# Patient Record
Sex: Female | Born: 1955 | Race: Black or African American | Hispanic: No | Smoking: Never smoker
Health system: Southern US, Community
[De-identification: ages and names within clinical notes are randomized; demographics above are authoritative.]

---

## 1997-12-03 ENCOUNTER — Ambulatory Visit (HOSPITAL_COMMUNITY): Admission: RE | Admit: 1997-12-03 | Discharge: 1997-12-04 | Payer: Self-pay

## 1999-01-25 ENCOUNTER — Other Ambulatory Visit: Admission: RE | Admit: 1999-01-25 | Discharge: 1999-01-25 | Payer: Self-pay | Admitting: *Deleted

## 2000-08-08 ENCOUNTER — Encounter: Admission: RE | Admit: 2000-08-08 | Discharge: 2000-08-08 | Payer: Self-pay | Admitting: *Deleted

## 2000-08-08 ENCOUNTER — Encounter: Payer: Self-pay | Admitting: *Deleted

## 2001-10-15 ENCOUNTER — Other Ambulatory Visit: Admission: RE | Admit: 2001-10-15 | Discharge: 2001-10-15 | Payer: Self-pay | Admitting: *Deleted

## 2003-11-25 ENCOUNTER — Other Ambulatory Visit: Admission: RE | Admit: 2003-11-25 | Discharge: 2003-11-25 | Payer: Self-pay | Admitting: Family Medicine

## 2005-05-26 ENCOUNTER — Other Ambulatory Visit: Admission: RE | Admit: 2005-05-26 | Discharge: 2005-05-26 | Payer: Self-pay | Admitting: Family Medicine

## 2007-01-15 ENCOUNTER — Encounter: Admission: RE | Admit: 2007-01-15 | Discharge: 2007-01-15 | Payer: Self-pay | Admitting: Family Medicine

## 2007-12-04 ENCOUNTER — Encounter: Admission: RE | Admit: 2007-12-04 | Discharge: 2007-12-04 | Payer: Self-pay | Admitting: Family Medicine

## 2014-03-03 ENCOUNTER — Other Ambulatory Visit: Payer: Self-pay | Admitting: Family Medicine

## 2014-03-03 ENCOUNTER — Other Ambulatory Visit (HOSPITAL_COMMUNITY)
Admission: RE | Admit: 2014-03-03 | Discharge: 2014-03-03 | Disposition: A | Discharge status: Home / Self Care | Payer: BC Managed Care – PPO | Source: Ambulatory Visit | Attending: Family Medicine | Admitting: Family Medicine

## 2014-03-03 DIAGNOSIS — Z124 Encounter for screening for malignant neoplasm of cervix: Secondary | ICD-10-CM | POA: Insufficient documentation

## 2014-03-05 LAB — CYTOLOGY - PAP

## 2017-09-25 ENCOUNTER — Other Ambulatory Visit (HOSPITAL_COMMUNITY)
Admission: RE | Admit: 2017-09-25 | Discharge: 2017-09-25 | Disposition: A | Discharge status: Home / Self Care | Payer: BC Managed Care – PPO | Source: Ambulatory Visit | Attending: Family Medicine | Admitting: Family Medicine

## 2017-09-25 ENCOUNTER — Other Ambulatory Visit: Payer: Self-pay | Admitting: Family Medicine

## 2017-09-25 DIAGNOSIS — Z124 Encounter for screening for malignant neoplasm of cervix: Secondary | ICD-10-CM | POA: Insufficient documentation

## 2017-09-27 LAB — CYTOLOGY - PAP
Diagnosis: NEGATIVE
HPV: NOT DETECTED

## 2021-02-20 ENCOUNTER — Encounter (HOSPITAL_COMMUNITY): Payer: Self-pay | Admitting: Emergency Medicine

## 2021-02-20 ENCOUNTER — Emergency Department (HOSPITAL_COMMUNITY)
Admission: EM | Admit: 2021-02-20 | Discharge: 2021-02-20 | Disposition: A | Discharge status: Home / Self Care | Payer: BC Managed Care – PPO | Attending: Emergency Medicine | Admitting: Emergency Medicine

## 2021-02-20 ENCOUNTER — Other Ambulatory Visit: Payer: Self-pay

## 2021-02-20 ENCOUNTER — Emergency Department (HOSPITAL_COMMUNITY): Payer: BC Managed Care – PPO

## 2021-02-20 DIAGNOSIS — H539 Unspecified visual disturbance: Secondary | ICD-10-CM

## 2021-02-20 DIAGNOSIS — H538 Other visual disturbances: Secondary | ICD-10-CM | POA: Diagnosis not present

## 2021-02-20 LAB — CBC WITH DIFFERENTIAL/PLATELET
Abs Immature Granulocytes: 0.02 10*3/uL (ref 0.00–0.07)
Basophils Absolute: 0 10*3/uL (ref 0.0–0.1)
Basophils Relative: 0 %
Eosinophils Absolute: 0.4 10*3/uL (ref 0.0–0.5)
Eosinophils Relative: 5 %
HCT: 43.4 % (ref 36.0–46.0)
Hemoglobin: 13.2 g/dL (ref 12.0–15.0)
Immature Granulocytes: 0 %
Lymphocytes Relative: 34 %
Lymphs Abs: 2.3 10*3/uL (ref 0.7–4.0)
MCH: 27.3 pg (ref 26.0–34.0)
MCHC: 30.4 g/dL (ref 30.0–36.0)
MCV: 89.7 fL (ref 80.0–100.0)
Monocytes Absolute: 0.6 10*3/uL (ref 0.1–1.0)
Monocytes Relative: 8 %
Neutro Abs: 3.5 10*3/uL (ref 1.7–7.7)
Neutrophils Relative %: 53 %
Platelets: 218 10*3/uL (ref 150–400)
RBC: 4.84 MIL/uL (ref 3.87–5.11)
RDW: 14.6 % (ref 11.5–15.5)
WBC: 6.8 10*3/uL (ref 4.0–10.5)
nRBC: 0 % (ref 0.0–0.2)

## 2021-02-20 LAB — BASIC METABOLIC PANEL
Anion gap: 10 (ref 5–15)
BUN: 20 mg/dL (ref 8–23)
CO2: 20 mmol/L — ABNORMAL LOW (ref 22–32)
Calcium: 9.7 mg/dL (ref 8.9–10.3)
Chloride: 108 mmol/L (ref 98–111)
Creatinine, Ser: 0.84 mg/dL (ref 0.44–1.00)
GFR, Estimated: >60 mL/min (ref >60)
Glucose, Bld: 91 mg/dL (ref 70–99)
Potassium: 4 mmol/L (ref 3.5–5.1)
Sodium: 138 mmol/L (ref 135–145)

## 2021-02-20 NOTE — ED Provider Notes (Signed)
Emergency Medicine Provider Triage Evaluation Note  Kimberly Flores , a 64 y.o. female  was evaluated in triage.  Pt complains of blurry vision, left eye only, no pain. Onset yesterday after attending a funeral. Using a lubricating drop without relief. No other deficits noted. Wears glasses, history of cataracts.  Review of Systems  Positive: Blurry vision Negative: Headache, eye pain  Physical Exam  BP 140/88 (BP Location: Right Arm)   Pulse 87   Temp 98.6 F (37 C) (Oral)   Resp 18   SpO2 100%  Gen:   Awake, no distress   Resp:  Normal effort  MSK:   Moves extremities without difficulty  Other:  Pupils equal and reactive   Medical Decision Making  Medically screening exam initiated at 6:02 PM.  Appropriate orders placed.  Kimberly Flores was informed that the remainder of the evaluation will be completed by another provider, this initial triage assessment does not replace that evaluation, and the importance of remaining in the ED until their evaluation is complete.     [name], PA-C 02/20/21 1809    [name], MD 02/22/21 1013

## 2021-02-20 NOTE — ED Triage Notes (Signed)
C/o blurred vision to L eye since yesterday.  Denies pain. Denies injury.

## 2021-02-20 NOTE — ED Provider Notes (Signed)
[facility] EMERGENCY DEPARTMENT Provider Note   CSN: [id number] Arrival date & time: 02/20/21  1753     History No chief complaint on file.   Kimberly Flores is a 65 y.o. female.  Pt presents to the ED today with left eye blurry vision.  Pt said sx started yesterday while attending a funeral.  She has tried some otc eye drops without relief.  She said she can see dark/light.  All visual fields are affected.  Pt does wear glasses and still has the blurry vision with glasses.        History reviewed. No pertinent past medical history.  There are no problems to display for this patient.   History reviewed. No pertinent surgical history.   OB History   No obstetric history on file.     No family history on file.  Social History   Tobacco Use   Smoking status: Never   Smokeless tobacco: Never  Substance Use Topics   Alcohol use: Not Currently   Drug use: Not Currently    Home Medications Prior to Admission medications   Not on File    Allergies    Patient has no allergy information on record.  Review of Systems   Review of Systems  Eyes:  Positive for visual disturbance.  All other systems reviewed and are negative.  Physical Exam Updated Vital Signs BP 140/88 (BP Location: Right Arm)   Pulse 87   Temp 98.6 F (37 C) (Oral)   Resp 18   SpO2 100%   Physical Exam Vitals and nursing note reviewed.  Constitutional:      Appearance: Normal appearance.  HENT:     Head: Normocephalic and atraumatic.     Right Ear: External ear normal.     Left Ear: External ear normal.     Nose: Nose normal.     Mouth/Throat:     Mouth: Mucous membranes are moist.     Pharynx: Oropharynx is clear.  Eyes:     Extraocular Movements: Extraocular movements intact.     Conjunctiva/sclera: Conjunctivae normal.     Pupils: Pupils are equal, round, and reactive to light.     Comments: No red reflex left eye.  Suspect vitreous hemorrhage.  Cardiovascular:      Rate and Rhythm: Normal rate and regular rhythm.     Pulses: Normal pulses.     Heart sounds: Normal heart sounds.  Pulmonary:     Effort: Pulmonary effort is normal.     Breath sounds: Normal breath sounds.  Abdominal:     General: Abdomen is flat. Bowel sounds are normal.     Palpations: Abdomen is soft.  Musculoskeletal:        General: Normal range of motion.     Cervical back: Normal range of motion and neck supple.  Skin:    General: Skin is warm.     Capillary Refill: Capillary refill takes less than 2 seconds.  Neurological:     General: No focal deficit present.     Mental Status: She is alert and oriented to person, place, and time.  Psychiatric:        Mood and Affect: Mood normal.        Behavior: Behavior normal.        Thought Content: Thought content normal.        Judgment: Judgment normal.    ED Results / Procedures / Treatments   Labs (all labs ordered are listed, but  only abnormal results are displayed) Labs Reviewed  BASIC METABOLIC PANEL - Abnormal; Notable for the following components:      Result Value   CO2 20 (*)    All other components within normal limits  CBC WITH DIFFERENTIAL/PLATELET  URINALYSIS, ROUTINE W REFLEX MICROSCOPIC    EKG None  Radiology MR BRAIN WO CONTRAST  Result Date: 02/20/2021 CLINICAL DATA:  Acute neurologic deficit EXAM: MRI HEAD WITHOUT CONTRAST TECHNIQUE: Multiplanar, multiecho pulse sequences of the brain and surrounding structures were obtained without intravenous contrast. COMPARISON:  None. FINDINGS: Brain: No acute infarct, mass effect or extra-axial collection. No acute or chronic hemorrhage. Normal white matter signal. Generalized volume loss without a clear lobar predilection. The midline structures are normal. Vascular: Major flow voids are preserved. Skull and upper cervical spine: Normal calvarium and skull base. Visualized upper cervical spine and soft tissues are normal. Sinuses/Orbits:No paranasal sinus  fluid levels or advanced mucosal thickening. No mastoid or middle ear effusion. Normal orbits. IMPRESSION: 1. No acute intracranial abnormality. 2. Generalized volume loss without a clear lobar predilection. Electronically Signed   By: Deatra Robinson M.D.   On: 02/20/2021 23:09    Procedures Procedures   Medications Ordered in ED Medications - No data to display  ED Course  I have reviewed the triage vital signs and the nursing notes.  Pertinent labs & imaging results that were available during my care of the patient were reviewed by me and considered in my medical decision making (see chart for details).    MDM Rules/Calculators/A&P                           MRI nl.  Pt instructed to f/u with Dr. Sherrine Maples (ophthalmology).  Return if worse. Final Clinical Impression(s) / ED Diagnoses Final diagnoses:  Vision disturbance    Rx / DC Orders ED Discharge Orders     None        Jacalyn Lefevre, MD 02/20/21 2319

## 2021-02-20 NOTE — ED Notes (Signed)
Pt transported to MRI 

## 2021-02-20 NOTE — ED Notes (Signed)
Patient transported to MRI 

## 2021-02-20 NOTE — ED Notes (Signed)
MRI   Jacalyn Lefevre, MD 02/20/21 2318

## 2022-11-27 DIAGNOSIS — B351 Tinea unguium: Secondary | ICD-10-CM | POA: Diagnosis not present

## 2022-11-27 DIAGNOSIS — M353 Polymyalgia rheumatica: Secondary | ICD-10-CM | POA: Diagnosis not present

## 2022-11-27 DIAGNOSIS — Z23 Encounter for immunization: Secondary | ICD-10-CM | POA: Diagnosis not present

## 2022-11-27 DIAGNOSIS — Z0000 Encounter for general adult medical examination without abnormal findings: Secondary | ICD-10-CM | POA: Diagnosis not present

## 2022-11-27 DIAGNOSIS — J309 Allergic rhinitis, unspecified: Secondary | ICD-10-CM | POA: Diagnosis not present

## 2022-11-27 DIAGNOSIS — E7800 Pure hypercholesterolemia, unspecified: Secondary | ICD-10-CM | POA: Diagnosis not present

## 2022-11-27 DIAGNOSIS — N952 Postmenopausal atrophic vaginitis: Secondary | ICD-10-CM | POA: Diagnosis not present

## 2022-11-27 DIAGNOSIS — M8588 Other specified disorders of bone density and structure, other site: Secondary | ICD-10-CM | POA: Diagnosis not present

## 2022-12-11 DIAGNOSIS — H401212 Low-tension glaucoma, right eye, moderate stage: Secondary | ICD-10-CM | POA: Diagnosis not present

## 2022-12-11 DIAGNOSIS — H0102B Squamous blepharitis left eye, upper and lower eyelids: Secondary | ICD-10-CM | POA: Diagnosis not present

## 2022-12-11 DIAGNOSIS — Z961 Presence of intraocular lens: Secondary | ICD-10-CM | POA: Diagnosis not present

## 2022-12-11 DIAGNOSIS — H40022 Open angle with borderline findings, high risk, left eye: Secondary | ICD-10-CM | POA: Diagnosis not present

## 2022-12-26 DIAGNOSIS — R700 Elevated erythrocyte sedimentation rate: Secondary | ICD-10-CM | POA: Diagnosis not present

## 2022-12-26 DIAGNOSIS — M1990 Unspecified osteoarthritis, unspecified site: Secondary | ICD-10-CM | POA: Diagnosis not present

## 2022-12-26 DIAGNOSIS — M549 Dorsalgia, unspecified: Secondary | ICD-10-CM | POA: Diagnosis not present

## 2022-12-26 DIAGNOSIS — M353 Polymyalgia rheumatica: Secondary | ICD-10-CM | POA: Diagnosis not present

## 2022-12-26 DIAGNOSIS — M25559 Pain in unspecified hip: Secondary | ICD-10-CM | POA: Diagnosis not present

## 2022-12-26 DIAGNOSIS — M25519 Pain in unspecified shoulder: Secondary | ICD-10-CM | POA: Diagnosis not present

## 2022-12-26 DIAGNOSIS — M8580 Other specified disorders of bone density and structure, unspecified site: Secondary | ICD-10-CM | POA: Diagnosis not present

## 2023-02-27 DIAGNOSIS — M25519 Pain in unspecified shoulder: Secondary | ICD-10-CM | POA: Diagnosis not present

## 2023-02-27 DIAGNOSIS — M1990 Unspecified osteoarthritis, unspecified site: Secondary | ICD-10-CM | POA: Diagnosis not present

## 2023-02-27 DIAGNOSIS — R700 Elevated erythrocyte sedimentation rate: Secondary | ICD-10-CM | POA: Diagnosis not present

## 2023-02-27 DIAGNOSIS — M25559 Pain in unspecified hip: Secondary | ICD-10-CM | POA: Diagnosis not present

## 2023-02-27 DIAGNOSIS — M79643 Pain in unspecified hand: Secondary | ICD-10-CM | POA: Diagnosis not present

## 2023-02-27 DIAGNOSIS — M549 Dorsalgia, unspecified: Secondary | ICD-10-CM | POA: Diagnosis not present

## 2023-02-27 DIAGNOSIS — M353 Polymyalgia rheumatica: Secondary | ICD-10-CM | POA: Diagnosis not present

## 2023-02-27 DIAGNOSIS — M8580 Other specified disorders of bone density and structure, unspecified site: Secondary | ICD-10-CM | POA: Diagnosis not present

## 2023-02-27 DIAGNOSIS — M0609 Rheumatoid arthritis without rheumatoid factor, multiple sites: Secondary | ICD-10-CM | POA: Diagnosis not present

## 2023-03-28 DIAGNOSIS — M1990 Unspecified osteoarthritis, unspecified site: Secondary | ICD-10-CM | POA: Diagnosis not present

## 2023-03-28 DIAGNOSIS — M8580 Other specified disorders of bone density and structure, unspecified site: Secondary | ICD-10-CM | POA: Diagnosis not present

## 2023-03-28 DIAGNOSIS — Z79899 Other long term (current) drug therapy: Secondary | ICD-10-CM | POA: Diagnosis not present

## 2023-03-28 DIAGNOSIS — M2550 Pain in unspecified joint: Secondary | ICD-10-CM | POA: Diagnosis not present

## 2023-03-28 DIAGNOSIS — M353 Polymyalgia rheumatica: Secondary | ICD-10-CM | POA: Diagnosis not present

## 2023-03-28 DIAGNOSIS — M0609 Rheumatoid arthritis without rheumatoid factor, multiple sites: Secondary | ICD-10-CM | POA: Diagnosis not present

## 2023-06-13 DIAGNOSIS — H2511 Age-related nuclear cataract, right eye: Secondary | ICD-10-CM | POA: Diagnosis not present

## 2023-06-13 DIAGNOSIS — H401232 Low-tension glaucoma, bilateral, moderate stage: Secondary | ICD-10-CM | POA: Diagnosis not present

## 2023-06-13 DIAGNOSIS — Z961 Presence of intraocular lens: Secondary | ICD-10-CM | POA: Diagnosis not present

## 2023-06-20 DIAGNOSIS — Z79899 Other long term (current) drug therapy: Secondary | ICD-10-CM | POA: Diagnosis not present

## 2023-06-20 DIAGNOSIS — M1990 Unspecified osteoarthritis, unspecified site: Secondary | ICD-10-CM | POA: Diagnosis not present

## 2023-06-20 DIAGNOSIS — M0609 Rheumatoid arthritis without rheumatoid factor, multiple sites: Secondary | ICD-10-CM | POA: Diagnosis not present

## 2023-06-20 DIAGNOSIS — M8580 Other specified disorders of bone density and structure, unspecified site: Secondary | ICD-10-CM | POA: Diagnosis not present

## 2023-06-20 DIAGNOSIS — M353 Polymyalgia rheumatica: Secondary | ICD-10-CM | POA: Diagnosis not present

## 2023-06-20 DIAGNOSIS — M2550 Pain in unspecified joint: Secondary | ICD-10-CM | POA: Diagnosis not present

## 2023-07-11 IMAGING — MR MR HEAD W/O CM
6 of 11 series · 24 of 48 positions shown · non-contrast
Comparison: None.

CLINICAL DATA: Acute neurologic deficit

EXAM:
MRI HEAD WITHOUT CONTRAST
TECHNIQUE: Multiplanar, multiecho pulse sequences of the brain and surrounding
structures were obtained without intravenous contrast.

[Series 2: DWI · axial · 3.0mm · 0.94mm/px · z∈[-75,+64]mm · 7 of 100 slices shown (1 of 2)]
[im 1/100]
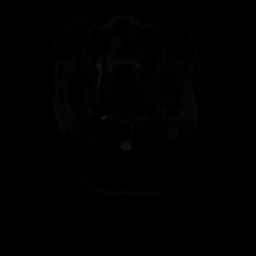
[im 17/100]
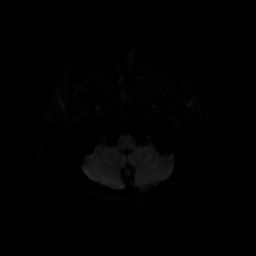
[im 34/100]
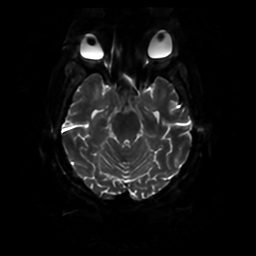
[im 50/100]
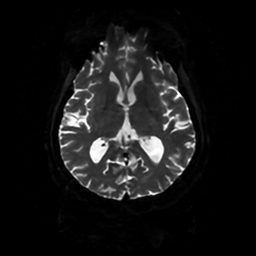
[im 67/100]
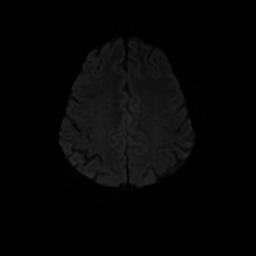
[im 83/100]
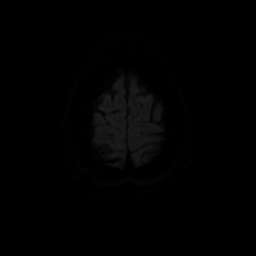
[im 100/100]
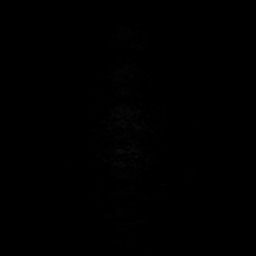

[Series 3: DWI · coronal · 4.0mm · 0.94mm/px · 5 of 74 slices shown (2 of 2)]
[im 1/74]
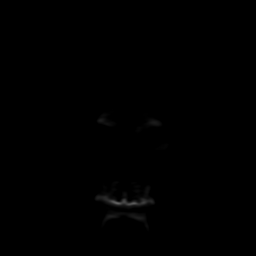
[im 19/74]
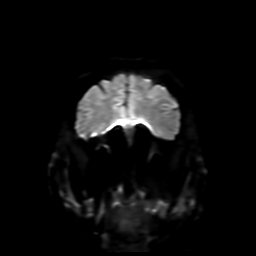
[im 37/74]
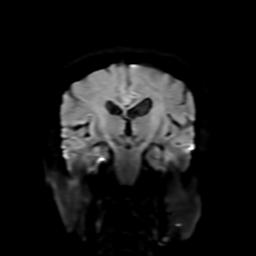
[im 55/74]
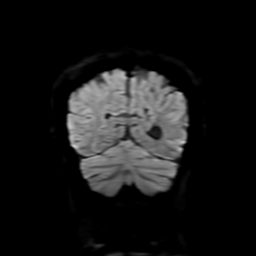
[im 74/74]
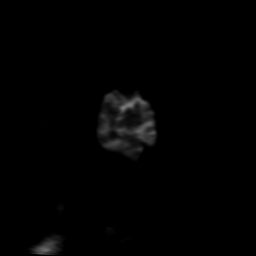

[Series 4: FLAIR · sagittal · 5.0mm · 0.23mm/px · 2 of 26 slices shown (1 of 2)]
[im 1/26]
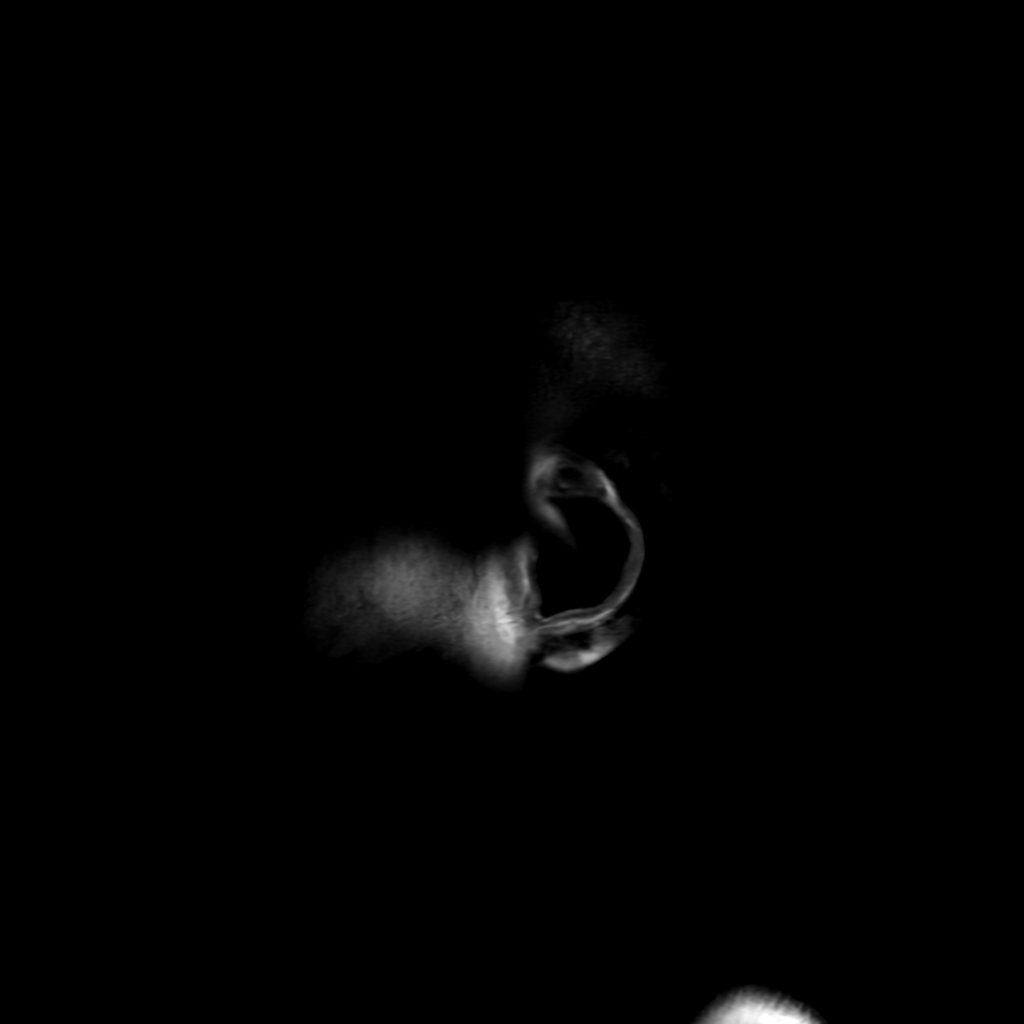
[im 26/26]
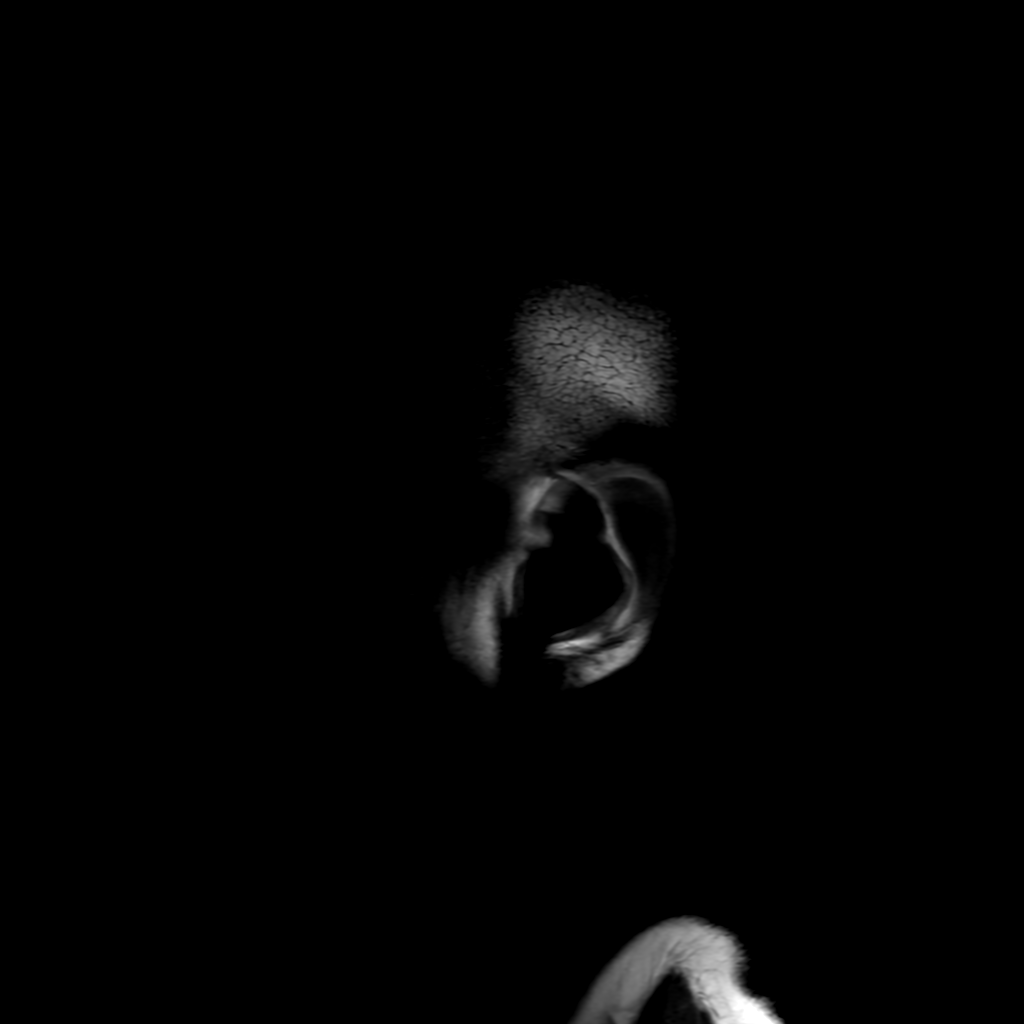

[Series 6: FLAIR · axial · 4.0mm · 0.45mm/px · z∈[-77,+64]mm · 3 of 35 slices shown (2 of 2)]
[im 1/35]
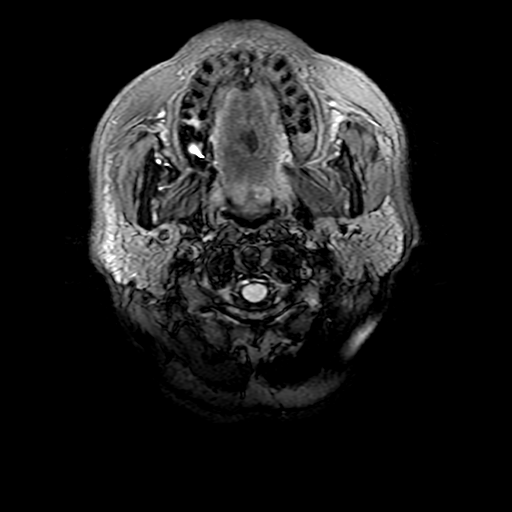
[im 18/35]
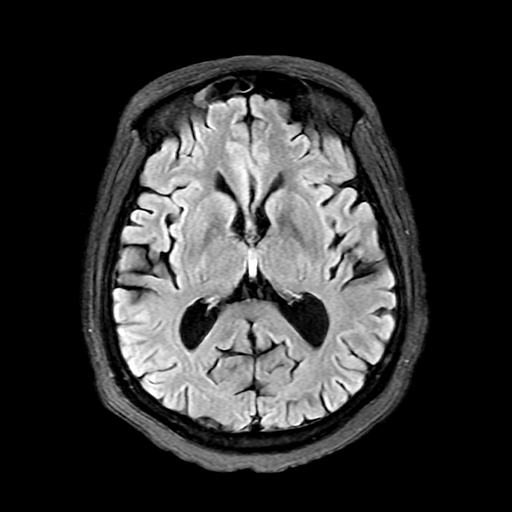
[im 35/35]
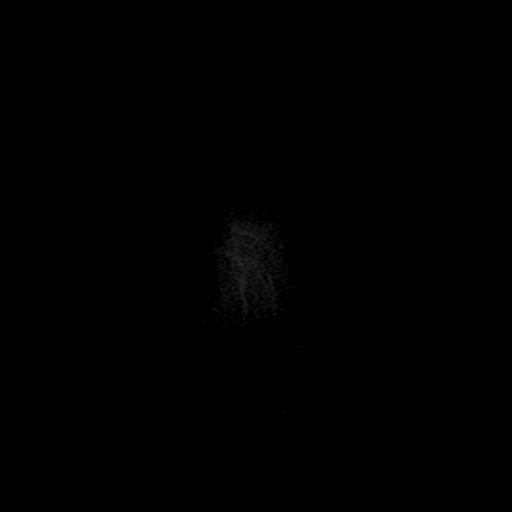

[Series 250: ADC · axial · 3.0mm · 0.94mm/px · z∈[-75,+64]mm · 4 of 50 slices shown (1 of 2)]
[im 1/50]
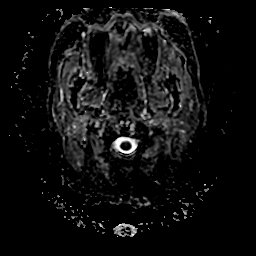
[im 17/50]
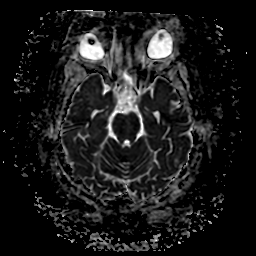
[im 33/50]
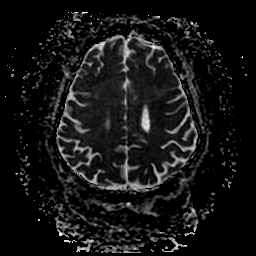
[im 50/50]
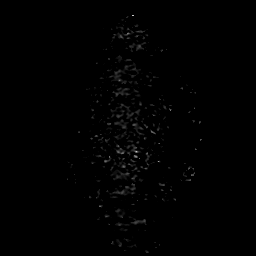

[Series 350: ADC · coronal · 4.0mm · 0.94mm/px · 3 of 37 slices shown (2 of 2)]
[im 1/37]
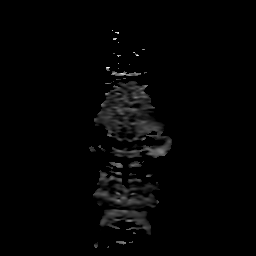
[im 19/37]
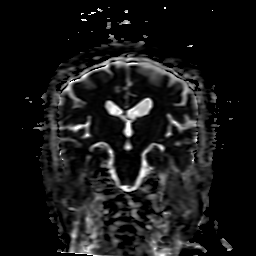
[im 37/37]
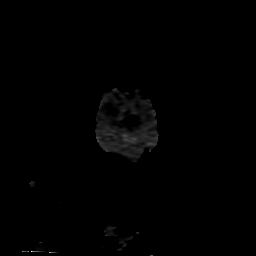

[24 of 48 positions shown; findings below may reference images not displayed]

FINDINGS: Brain: No acute infarct, mass effect or extra-axial collection. No
acute or chronic hemorrhage. Normal white matter signal. Generalized
volume loss without a clear lobar predilection. The midline
structures are normal.

Vascular: Major flow voids are preserved.

Skull and upper cervical spine: Normal calvarium and skull base.
Visualized upper cervical spine and soft tissues are normal.

Sinuses/Orbits:No paranasal sinus fluid levels or advanced mucosal
thickening. No mastoid or middle ear effusion. Normal orbits.
IMPRESSION: 1. No acute intracranial abnormality.
2. Generalized volume loss without a clear lobar predilection.

## 2023-08-28 DIAGNOSIS — Z961 Presence of intraocular lens: Secondary | ICD-10-CM | POA: Diagnosis not present

## 2023-08-28 DIAGNOSIS — H401232 Low-tension glaucoma, bilateral, moderate stage: Secondary | ICD-10-CM | POA: Diagnosis not present

## 2023-10-24 DIAGNOSIS — M0609 Rheumatoid arthritis without rheumatoid factor, multiple sites: Secondary | ICD-10-CM | POA: Diagnosis not present

## 2023-10-24 DIAGNOSIS — M8580 Other specified disorders of bone density and structure, unspecified site: Secondary | ICD-10-CM | POA: Diagnosis not present

## 2023-10-24 DIAGNOSIS — M353 Polymyalgia rheumatica: Secondary | ICD-10-CM | POA: Diagnosis not present

## 2023-10-24 DIAGNOSIS — M2550 Pain in unspecified joint: Secondary | ICD-10-CM | POA: Diagnosis not present

## 2023-10-24 DIAGNOSIS — Z79899 Other long term (current) drug therapy: Secondary | ICD-10-CM | POA: Diagnosis not present

## 2023-10-24 DIAGNOSIS — M1990 Unspecified osteoarthritis, unspecified site: Secondary | ICD-10-CM | POA: Diagnosis not present

## 2023-10-30 DIAGNOSIS — Z1231 Encounter for screening mammogram for malignant neoplasm of breast: Secondary | ICD-10-CM | POA: Diagnosis not present

## 2023-12-25 DIAGNOSIS — Z961 Presence of intraocular lens: Secondary | ICD-10-CM | POA: Diagnosis not present

## 2023-12-25 DIAGNOSIS — H2511 Age-related nuclear cataract, right eye: Secondary | ICD-10-CM | POA: Diagnosis not present

## 2023-12-25 DIAGNOSIS — H401232 Low-tension glaucoma, bilateral, moderate stage: Secondary | ICD-10-CM | POA: Diagnosis not present

## 2023-12-27 DIAGNOSIS — M8588 Other specified disorders of bone density and structure, other site: Secondary | ICD-10-CM | POA: Diagnosis not present

## 2023-12-27 DIAGNOSIS — N952 Postmenopausal atrophic vaginitis: Secondary | ICD-10-CM | POA: Diagnosis not present

## 2023-12-27 DIAGNOSIS — R635 Abnormal weight gain: Secondary | ICD-10-CM | POA: Diagnosis not present

## 2023-12-27 DIAGNOSIS — B351 Tinea unguium: Secondary | ICD-10-CM | POA: Diagnosis not present

## 2023-12-27 DIAGNOSIS — Z0000 Encounter for general adult medical examination without abnormal findings: Secondary | ICD-10-CM | POA: Diagnosis not present

## 2023-12-27 DIAGNOSIS — M353 Polymyalgia rheumatica: Secondary | ICD-10-CM | POA: Diagnosis not present

## 2023-12-27 DIAGNOSIS — E7800 Pure hypercholesterolemia, unspecified: Secondary | ICD-10-CM | POA: Diagnosis not present

## 2023-12-27 DIAGNOSIS — Z7952 Long term (current) use of systemic steroids: Secondary | ICD-10-CM | POA: Diagnosis not present

## 2023-12-27 DIAGNOSIS — J309 Allergic rhinitis, unspecified: Secondary | ICD-10-CM | POA: Diagnosis not present

## 2024-01-28 DIAGNOSIS — M2550 Pain in unspecified joint: Secondary | ICD-10-CM | POA: Diagnosis not present

## 2024-01-28 DIAGNOSIS — M353 Polymyalgia rheumatica: Secondary | ICD-10-CM | POA: Diagnosis not present

## 2024-01-28 DIAGNOSIS — M1990 Unspecified osteoarthritis, unspecified site: Secondary | ICD-10-CM | POA: Diagnosis not present

## 2024-01-28 DIAGNOSIS — M0609 Rheumatoid arthritis without rheumatoid factor, multiple sites: Secondary | ICD-10-CM | POA: Diagnosis not present

## 2024-01-28 DIAGNOSIS — M8580 Other specified disorders of bone density and structure, unspecified site: Secondary | ICD-10-CM | POA: Diagnosis not present

## 2024-01-28 DIAGNOSIS — Z79899 Other long term (current) drug therapy: Secondary | ICD-10-CM | POA: Diagnosis not present

## 2024-01-29 DIAGNOSIS — M353 Polymyalgia rheumatica: Secondary | ICD-10-CM | POA: Diagnosis not present

## 2024-04-09 DIAGNOSIS — M8580 Other specified disorders of bone density and structure, unspecified site: Secondary | ICD-10-CM | POA: Diagnosis not present

## 2024-04-09 DIAGNOSIS — M0609 Rheumatoid arthritis without rheumatoid factor, multiple sites: Secondary | ICD-10-CM | POA: Diagnosis not present

## 2024-04-09 DIAGNOSIS — M1990 Unspecified osteoarthritis, unspecified site: Secondary | ICD-10-CM | POA: Diagnosis not present

## 2024-04-09 DIAGNOSIS — M2550 Pain in unspecified joint: Secondary | ICD-10-CM | POA: Diagnosis not present

## 2024-04-09 DIAGNOSIS — M353 Polymyalgia rheumatica: Secondary | ICD-10-CM | POA: Diagnosis not present

## 2024-04-09 DIAGNOSIS — Z79899 Other long term (current) drug therapy: Secondary | ICD-10-CM | POA: Diagnosis not present

## 2024-04-22 DIAGNOSIS — H401232 Low-tension glaucoma, bilateral, moderate stage: Secondary | ICD-10-CM | POA: Diagnosis not present

## 2024-04-23 DIAGNOSIS — H25041 Posterior subcapsular polar age-related cataract, right eye: Secondary | ICD-10-CM | POA: Diagnosis not present

## 2024-04-23 DIAGNOSIS — H35373 Puckering of macula, bilateral: Secondary | ICD-10-CM | POA: Diagnosis not present

## 2024-04-23 DIAGNOSIS — Z961 Presence of intraocular lens: Secondary | ICD-10-CM | POA: Diagnosis not present

## 2024-04-23 DIAGNOSIS — H18513 Endothelial corneal dystrophy, bilateral: Secondary | ICD-10-CM | POA: Diagnosis not present

## 2024-04-23 DIAGNOSIS — H2511 Age-related nuclear cataract, right eye: Secondary | ICD-10-CM | POA: Diagnosis not present

## 2024-05-21 DIAGNOSIS — M2550 Pain in unspecified joint: Secondary | ICD-10-CM | POA: Diagnosis not present

## 2024-05-21 DIAGNOSIS — M8580 Other specified disorders of bone density and structure, unspecified site: Secondary | ICD-10-CM | POA: Diagnosis not present

## 2024-05-21 DIAGNOSIS — Z79899 Other long term (current) drug therapy: Secondary | ICD-10-CM | POA: Diagnosis not present

## 2024-05-21 DIAGNOSIS — M1990 Unspecified osteoarthritis, unspecified site: Secondary | ICD-10-CM | POA: Diagnosis not present

## 2024-05-21 DIAGNOSIS — R700 Elevated erythrocyte sedimentation rate: Secondary | ICD-10-CM | POA: Diagnosis not present

## 2024-05-21 DIAGNOSIS — M353 Polymyalgia rheumatica: Secondary | ICD-10-CM | POA: Diagnosis not present

## 2024-05-21 DIAGNOSIS — M0609 Rheumatoid arthritis without rheumatoid factor, multiple sites: Secondary | ICD-10-CM | POA: Diagnosis not present
